# Patient Record
Sex: Female | Born: 1995 | Race: White | Hispanic: Yes | Marital: Single | Smoking: Never smoker
Health system: Southern US, Community
[De-identification: ages and names within clinical notes are randomized; demographics above are authoritative.]

---
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
## 2015-09-02 ENCOUNTER — Encounter: Payer: Self-pay | Admitting: *Deleted

## 2015-09-02 ENCOUNTER — Emergency Department: Payer: Medicaid Other

## 2015-09-02 DIAGNOSIS — Y9289 Other specified places as the place of occurrence of the external cause: Secondary | ICD-10-CM | POA: Insufficient documentation

## 2015-09-02 DIAGNOSIS — S63502A Unspecified sprain of left wrist, initial encounter: Secondary | ICD-10-CM | POA: Diagnosis not present

## 2015-09-02 DIAGNOSIS — Y9389 Activity, other specified: Secondary | ICD-10-CM | POA: Insufficient documentation

## 2015-09-02 DIAGNOSIS — X58XXXA Exposure to other specified factors, initial encounter: Secondary | ICD-10-CM | POA: Diagnosis not present

## 2015-09-02 DIAGNOSIS — Y998 Other external cause status: Secondary | ICD-10-CM | POA: Diagnosis not present

## 2015-09-02 DIAGNOSIS — M25532 Pain in left wrist: Secondary | ICD-10-CM | POA: Diagnosis present

## 2015-09-02 NOTE — ED Notes (Addendum)
Pt has left wrist pain.  Sx for 2 days.  States picked a heavy box at work 2 days ago.  Swelling noted. States WC.

## 2015-09-03 ENCOUNTER — Emergency Department
Admission: EM | Admit: 2015-09-03 | Discharge: 2015-09-03 | Disposition: A | Discharge status: Home / Self Care | Payer: Medicaid Other | Attending: Emergency Medicine | Admitting: Emergency Medicine

## 2015-09-03 DIAGNOSIS — S63502A Unspecified sprain of left wrist, initial encounter: Secondary | ICD-10-CM

## 2015-09-03 MED ORDER — IBUPROFEN 800 MG PO TABS
ORAL_TABLET | ORAL | Status: AC
  Filled 2015-09-03: qty 1

## 2015-09-03 MED ORDER — IBUPROFEN 800 MG PO TABS
800.0000 mg | ORAL_TABLET | Freq: Once | ORAL | Status: AC
  Administered 2015-09-03: 800 mg via ORAL

## 2015-09-03 NOTE — ED Provider Notes (Signed)
[facility] Emergency Department Provider Note  ____________________________________________  Time seen: 3:50 AM  I have reviewed the triage vital signs and the nursing notes.   HISTORY  Chief Complaint Wrist Pain    HPI Helen Bradley is a 19 y.o. female presents with nontraumatic left wrist pain status post lifting boxes at work 2 days ago. Patient states current pain score is 4 out of 10 and nonradiating.     Past medical history None There are no active problems to display for this patient.  Past surgical history None No current outpatient prescriptions on file.  Allergies No known drug allergies No family history on file.  Social History Social History  Substance Use Topics  . Smoking status: Never Smoker   . Smokeless tobacco: Not on file  . Alcohol Use: No    Review of Systems  Constitutional: Negative for fever. Eyes: Negative for visual changes. ENT: Negative for sore throat. Cardiovascular: Negative for chest pain. Respiratory: Negative for shortness of breath. Gastrointestinal: Negative for abdominal pain, vomiting and diarrhea. Genitourinary: Negative for dysuria. Musculoskeletal: Negative for back pain.Positive for left wrist pain Skin: Negative for rash. Neurological: Negative for headaches, focal weakness or numbness.   10-point ROS otherwise negative.  ____________________________________________   PHYSICAL EXAM:  VITAL SIGNS: ED Triage Vitals  Enc Vitals Group     BP 09/02/15 2318 99/65 mmHg     Pulse Rate 09/02/15 2314 78     Resp 09/02/15 2314 20     Temp 09/02/15 2314 98.2 F (36.8 C)     Temp Source 09/02/15 2314 Oral     SpO2 09/02/15 2314 99 %     Weight 09/02/15 2314 200 lb (90.719 kg)     Height 09/02/15 2314 4\' 11"  (1.499 m)     Head Cir --      Peak Flow --      Pain Score 09/02/15 2317 5     Pain Loc --      Pain Edu? --      Excl. in GC? --      Constitutional: Alert and oriented.  Well appearing and in no distress. Eyes: Conjunctivae are normal. PERRL. Normal extraocular movements. ENT   Head: Normocephalic and atraumatic.   Nose: No congestion/rhinnorhea.   Mouth/Throat: Mucous membranes are moist.   Neck: No stridor. Cardiovascular: Normal rate, regular rhythm. Normal and symmetric distal pulses are present in all extremities. No murmurs, rubs, or gallops. Respiratory: Normal respiratory effort without tachypnea nor retractions. Breath sounds are clear and equal bilaterally. No wheezes/rales/rhonchi. Gastrointestinal: Soft and nontender. No distention. There is no CVA tenderness. Genitourinary: deferred Musculoskeletal:  No joint effusions.  Positive left wrist pain pain with range of motion both active and passive Neurologic:  Normal speech and language. No gross focal neurologic deficits are appreciated. Speech is normal.  Skin:  Skin is warm, dry and intact. No rash noted. Psychiatric: Mood and affect are normal. Speech and behavior are normal. Patient exhibits appropriate insight and judgment.      RADIOLOGY  DG Wrist Complete Left (Final result) Result time: 09/03/15 00:00:46   Final result by Rad Results In Interface (09/03/15 00:00:46)   Narrative:   CLINICAL DATA: Lifting boxes at work and started having pain over the radial side of the left wrist.  EXAM: LEFT WRIST - COMPLETE 3+ VIEW  COMPARISON: None.  FINDINGS: There is no evidence of fracture or dislocation. There is no evidence of arthropathy or other focal bone abnormality. Soft  tissues are unremarkable.  IMPRESSION: Negative.   Electronically Signed By: Elberta Fortis M.D. On: 09/03/2015 00:00              INITIAL IMPRESSION / ASSESSMENT AND PLAN / ED COURSE  Pertinent labs & imaging results that were available during my care of the patient were reviewed by me and considered in my medical decision making (see chart for  details).   ____________________________________________   FINAL CLINICAL IMPRESSION(S) / ED DIAGNOSES  Final diagnoses:  Left wrist sprain, initial encounter      Darci Current, MD 09/03/15 [id number]

## 2015-09-03 NOTE — Discharge Instructions (Signed)
Gasper Sells para la Advice worker  (Wrist Splint)  Una frula para la mueca sostiene la [place] en una posicin determinada, de modo que no pueda moverse (posicin fija). Ayuda a que los [employer] rotos y los esguinces se curen ms rpido, con Psychologist, educational. Tambin puede ayudar a Technical sales engineer presin sobre el nervio que baja por la mitad del brazo (nervio mediano) [place] dedos. CUIDADOS EN EL HOGAR   Use la frula segn las indicaciones de su mdico. Puede usarla mientras duerme.  Haga los ejercicios que le indic su mdico. Estos ejercicios ayudan a Pharmacologist la fuerza muscular en la mano y la [place]. Tambin ayudan a [employer] dedos. SOLICITE AYUDA DE INMEDIATO SI:   Pierde la sensibilidad en la mano o en los dedos.  La piel o las yemas de los dedos estn de color azul o gris o los siente fros. ASEGRESE DE QUE:   Comprende estas instrucciones.  Controlar su enfermedad.  Solicitar ayuda de inmediato si no mejora o si empeora. Document Released: 03/05/2009 Document Revised: 02/29/2012 [facility] Patient Information 2015 [place], [place]. This information is not intended to replace advice given to you by your health care provider. Make sure you discuss any questions you have with your health care provider.

## 2016-01-28 ENCOUNTER — Encounter: Payer: Self-pay | Admitting: Emergency Medicine

## 2016-01-28 DIAGNOSIS — G8929 Other chronic pain: Secondary | ICD-10-CM | POA: Insufficient documentation

## 2016-01-28 DIAGNOSIS — M545 Low back pain: Secondary | ICD-10-CM | POA: Insufficient documentation

## 2016-01-28 DIAGNOSIS — Z3202 Encounter for pregnancy test, result negative: Secondary | ICD-10-CM | POA: Insufficient documentation

## 2016-01-28 NOTE — ED Notes (Signed)
Pt arrived to the ED accompanied by her family for complaints of back pain for the last 7 days. Pt states that she has been ignoring the pain but today the pain got worse. Pt denies any injury. Pt is AOx4 in no apparent distress.

## 2016-01-29 ENCOUNTER — Emergency Department
Admission: EM | Admit: 2016-01-29 | Discharge: 2016-01-29 | Disposition: A | Discharge status: Home / Self Care | Payer: Medicaid Other | Attending: Emergency Medicine | Admitting: Emergency Medicine

## 2016-01-29 DIAGNOSIS — M545 Low back pain: Secondary | ICD-10-CM

## 2016-01-29 LAB — URINALYSIS COMPLETE WITH MICROSCOPIC (ARMC ONLY)
BACTERIA UA: NONE SEEN
Bilirubin Urine: NEGATIVE
GLUCOSE, UA: NEGATIVE mg/dL
Ketones, ur: NEGATIVE mg/dL
NITRITE: NEGATIVE
PH: 6 (ref 5.0–8.0)
PROTEIN: 100 mg/dL — AB
Specific Gravity, Urine: 1.028 (ref 1.005–1.030)
Squamous Epithelial / LPF: NONE SEEN

## 2016-01-29 LAB — POCT PREGNANCY, URINE: PREG TEST UR: NEGATIVE

## 2016-01-29 NOTE — Discharge Instructions (Signed)
You have been seen in the Emergency Department (ED)  today for back pain.  Your workup and exam have not shown any acute abnormalities and you are likely suffering from muscle strain or possible problems with your discs, but there is no treatment that will fix your symptoms at this time.  Please take Motrin (ibuprofen) as needed for your pain according to the instructions written on the box.  Alternatively, for the next five days you can take 600mg  three times daily with meals (it may upset your stomach).  Please follow up with your doctor as soon as possible regarding today's ED visit and your back pain.  Return to the ED for worsening back pain, fever, weakness or numbness of either leg, or if you develop either (1) an inability to urinate or have bowel movements, or (2) loss of your ability to control your bathroom functions (if you start having "accidents"), or if you develop other new symptoms that concern you.   Dolor de espalda en adultos (Back Pain, Adult) El dolor de espalda es muy frecuente en los adultos.La causa del dolor de espalda es rara vez peligrosa y Chief Technology Officer a menudo mejora con el [place].Es posible que se desconozca la causa de esta afeccin. Algunas causas comunes son las siguientes:  Distensin de los msculos o ligamentos que sostienen la columna vertebral.  Chiropractor (degeneracin) de los discos vertebrales.  Artritis.  Lesiones directas en la espalda. En [employer], el dolor de espalda es recurrente. Como rara vez es peligroso, las personas pueden aprender a Psychologist, clinical afeccin por s mismas. INSTRUCCIONES PARA EL CUIDADO EN EL HOGAR Controle su dolor de espalda a fin de Public house manager cambio. Las siguientes indicaciones ayudarn a Architectural technologist que pueda sentir:  Medical illustrator. Si permanece sentado o de pie en un mismo lugar durante mucho tiempo, se tensiona la espalda. No se siente, conduzca o permanezca de pie en un mismo lugar durante ms de 30  minutos seguidos. Realice caminatas cortas en superficies planas tan pronto como le sea posible.Trate de caminar un poco ms de Pharmacist, community.  Haga ejercicio regularmente como se lo haya indicado el mdico. El ejercicio ayuda a que su espalda se cure ms rpidamente. Tambin ayuda a prevenir futuras lesiones al [employer] fuertes y flexibles.  No permanezca en la cama.Si hace reposo ms de 1 a 2 das, puede demorar su recuperacin.  Preste atencin a su cuerpo al inclinarse y levantarse. Las posiciones ms cmodas son las que ejercen menos tensin en la espalda en recuperacin. Siempre use tcnicas apropiadas para levantar objetos, como por ejemplo:  Flexionar las rodillas.  Mantener la carga cerca del cuerpo.  No torcerse.  Encuentre una posicin cmoda para dormir. Use un colchn firme y recustese de costado con las rodillas ligeramente flexionadas. Si se recuesta [employer], coloque una almohada debajo de las rodillas.  Evite sentir ansiedad o estrs.El estrs aumenta la tensin muscular y puede empeorar el dolor de espalda.Es importante reconocer si se siente ansioso o estresado y aprender maneras de controlarlo, por ejemplo haciendo ejercicio.  Tome los medicamentos solamente como se lo haya indicado el mdico. Los medicamentos de venta libre para Engineer, materials y la inflamacin a menudo son los ms eficaces.El mdico puede recetarle relajantes musculares.Estos medicamentos ayudan a Primary school teacher de modo que pueda reanudar ms rpidamente sus actividades normales y el ejercicio saludable.  Aplique hielo sobre la zona lesionada.  Ponga el hielo en una bolsa plstica.  Coloque una toalla entre la piel y la bolsa de hielo.  Deje el hielo durante , 2 a 3veces por da, durante los primeros 2 o 3das. Despus de eso, puede alternar el hielo y el calor para reducir Chief Technology Officer y los espasmos.  Mantenga un peso saludable. El exceso de peso ejerce  presin adicional sobre la espalda y hace que resulte difcil mantener una buena [place]. SOLICITE ATENCIN MDICA SI:  Siente un dolor que no se alivia con reposo o medicamentos.  Siente mucho dolor que se extiende a las piernas o los glteos.  El dolor no mejora en una semana.  Siente dolor por la noche.  Pierde peso.  Siente escalofros o fiebre. SOLICITE ATENCIN MDICA DE INMEDIATO SI:   Tiene nuevos problemas para controlar la vejiga o los intestinos.  Siente debilidad o adormecimiento inusuales en los brazos o en las piernas.  Siente nuseas o vmitos.  Siente dolor abdominal.  Siente que va a desmayarse.   Esta informacin no tiene Theme park manager el consejo del mdico. Asegrese de hacerle al mdico cualquier pregunta que tenga.   Document Released: 12/07/2005 Document Revised: 12/28/2014 Elsevier Interactive Patient Education 2016 Elsevier Inc.  Terapia con calor (Heat Therapy) La terapia con calor puede ayudar a aliviar articulaciones y msculos doloridos, lesionados, tensos y rgidos. El calor [employer], lo cual puede ayudar a Engineer, materials.  RIESGOS Y COMPLICACIONES Si tiene cualquiera de los [address], no utilice la terapia con calor a menos que su mdico lo haya autorizado:  Mala circulacin.  Heridas que se estn curando o piel con cicatrices en la zona a tratar.  Diabetes, enfermedades cardacas o hipertensin arterial.  Incapacidad de sentir (entumecimiento) la zona tratada.  Hinchazn inusual de la zona a tratar.  Infecciones activas.  Cogulos sanguneos.  Cncer.  Incapacidad de Marketing executive. Esto puede incluir nios pequeos y personas que tienen problemas con la funcin cerebral (demencia).  Embarazo. La terapia con calor solo se debe usar en lesiones viejas, preexistentes o de larga duracin (crnicas). No utilice la terapia con calor en lesiones nuevas a menos que el mdico se lo indique. CMO USAR LA  TERAPIA CON CALOR Existen varios tipos distintos de terapia con calor, como:  Compresas hmedas calientes.  Bao de agua caliente.  Bolsa de agua caliente.  Almohadilla trmica.  Bolsa de gel caliente.  Vendaje caliente.  Almohadilla trmica. Utilice el mtodo de terapia con calor que le sugiera su mdico. Siga las indicaciones del mdico sobre cmo y cundo usar la terapia con Airline pilot. RECOMENDACIONES GENERALES PARA LA TERAPIA CON CALOR  No duerma mientras [place] la terapia con calor. Utilice la terapia con calor solo mientras est despierto.  La piel puede volverse rosada mientras [place] la terapia con calor. No use la terapia con calor si la piel se pone roja.  No use la terapia con calor si siente un dolor nuevo.  Una temperatura muy alta o una exposicin prolongada al calor puede causar quemaduras. Sea cauto con la terapia de calor para evitar quemar la piel.  No use la terapia con calor en zonas de la piel que ya estn irritadas, como con una erupcin o una quemadura de sol. SOLICITE ATENCIN MDICA SI:  Observa ampollas, enrojecimiento, hinchazn o adormecimiento.  Siente un dolor nuevo.  El dolor [place]. ASEGRESE DE QUE:  Comprende estas instrucciones.  Controlar su afeccin.  Recibir ayuda de inmediato si no mejora o si empeora.   Esta informacin no tiene [employer]  fin reemplazar el consejo del mdico. Asegrese de hacerle al mdico cualquier pregunta que tenga.   Document Released: 02/29/2012 Document Revised: 12/28/2014 Elsevier Interactive Patient Education 2016 [employer].  Google msculoesqueltico (Musculoskeletal Pain) El dolor musculoesqueltico se siente en huesos y msculos. El dolor puede ocurrir en cualquier parte del cuerpo. El profesional que lo asiste podr tratarlo sin Geologist, engineering causa del dolor. Lo tratar Time [employer] de laboratorio (sangre y [place]), las radiografas y otros estudios sean normales. La causa de estos dolores puede ser un  virus.  CAUSAS Generalmente no existe una causa definida para este trastorno. Tambin el Citigroup puede deberse a la [place]. En la actividad excesiva se incluye el hacer ejercicios fsicos muy intensos cuando no se est en buena forma. El dolor de huesos tambin puede deberse a cambios climticos. Los huesos son sensibles a los cambios en la presin atmosfrica. INSTRUCCIONES PARA EL CUIDADO DOMICILIARIO  Para proteger su privacidad, no se entregarn los [employer] pruebas por telfono. Asegrese de conseguirlos. Consulte el modo en que podr obtenerlos si no se lo han informado. Es su responsabilidad contar con los [employer].  Utilice los medicamentos de venta libre o de prescripcin para Chief Technology Officer, Environmental health practitioner o la [place], segn se lo indique el profesional que lo asiste. Si le han administrado medicamentos, no conduzca, no opere maquinarias ni Diplomatic Services operational officer, y tampoco firme documentos legales durante 24 horas. No beba alcohol. No tome pldoras para dormir ni otros medicamentos que Museum/gallery curator.  Podr seguir con todas las actividades a menos que stas le ocasionen ms [employer]ck & [employer]. Cuando el dolor disminuya, es importante que gradualmente reanude toda la rutina habitual. Retome las actividades comenzando lentamente. Aumente gradualmente la intensidad y la duracin de sus actividades o del ejercicio.  Durante los perodos de dolor intenso, el reposo en cama puede ser beneficioso. Recustese o sintese en la posicin que le sea ms cmoda.  Coloque hielo sobre la zona afectada.  Ponga hielo en [place] Shut[place].  Colquese una toalla entre la piel y la bolsa de hielo.  Aplique el hielo durante 10 a 20 minutos 3  4 veces por da.  Si el dolor empeora, o no desaparece puede ser [employer] repetir las pruebas o Education officer, environmental nuevos exmenes. El profesional que lo asiste podr requerir investigar ms profundamente para Veterinary surgeon causa  posible. SOLICITE ATENCIN MDICA DE INMEDIATO SI:  Siente que el dolor empeora y no se alivia con los medicamentos.  Siente dolor en el pecho asociado a falta de aire, sudoracin, nuseas o vmitos.  El dolor se localiza en el abdomen.  Comienza a sentir nuevos sntomas que parecen ser diferentes o que lo preocupan. ASEGRESE DE QUE:   Comprende las instrucciones para el alta mdica.  Controlar su enfermedad.  Solicitar atencin mdica de inmediato segn las indicaciones.   Esta informacin no tiene Theme park manager el consejo del mdico. Asegrese de hacerle al mdico cualquier pregunta que tenga.   Document Released: 09/16/2005 Document Revised: 02/29/2012 Elsevier Interactive Patient Education [employer].
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
## 2016-01-29 NOTE — ED Notes (Signed)
Negative urine pregnancy

## 2016-01-30 LAB — URINE CULTURE: SPECIAL REQUESTS: NORMAL

## 2016-08-11 IMAGING — CR DG WRIST COMPLETE 3+V*L*
1 series · 4 of 4 positions shown · non-contrast
Comparison: None.

CLINICAL DATA: Lifting boxes at work and started having pain over
the radial side of the left wrist.

EXAM:
LEFT WRIST - COMPLETE 3+ VIEW

[Series 1: dg wrist complete left · 0.14mm/px · 4 of 4 slices shown]
[im 1/4]
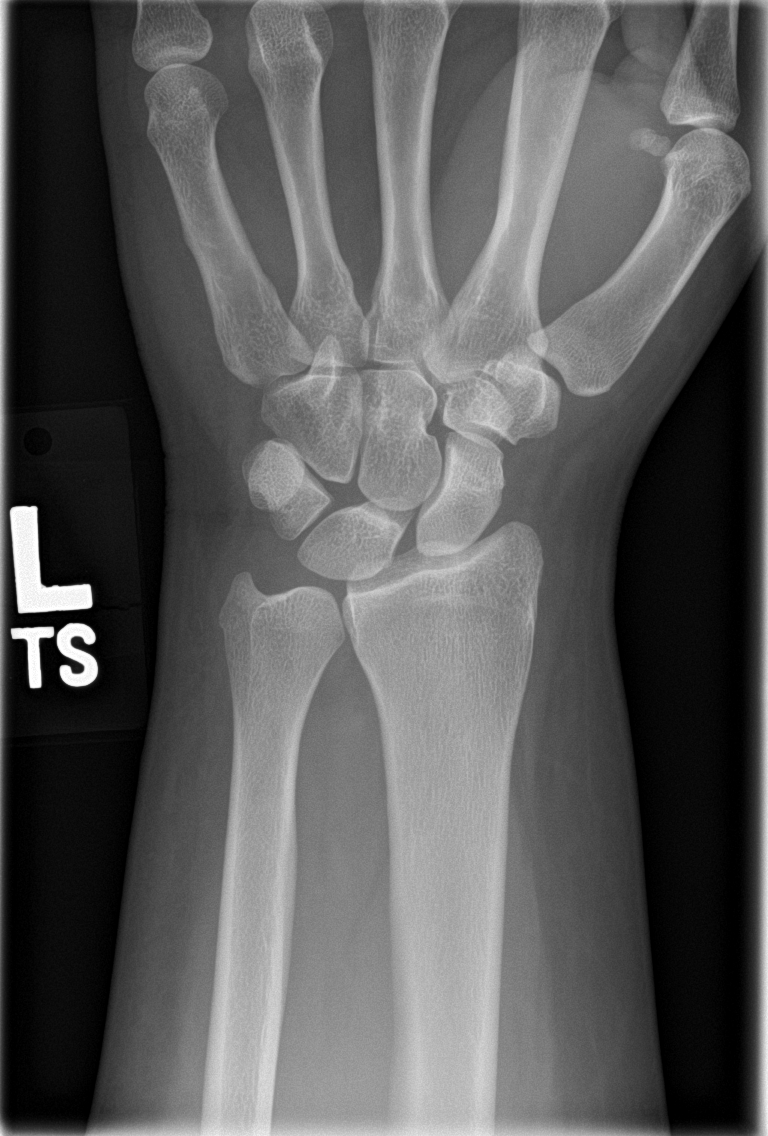
[im 2/4]
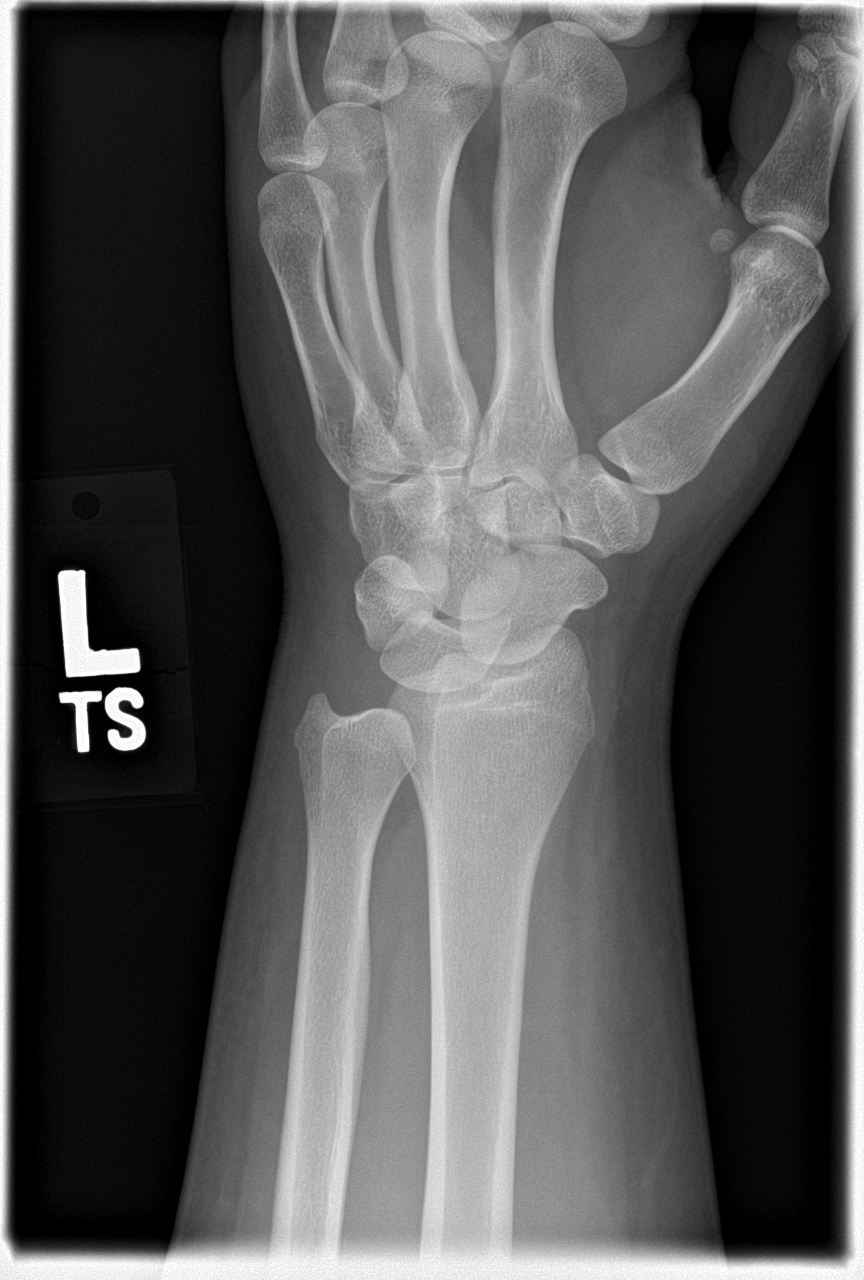
[im 3/4]
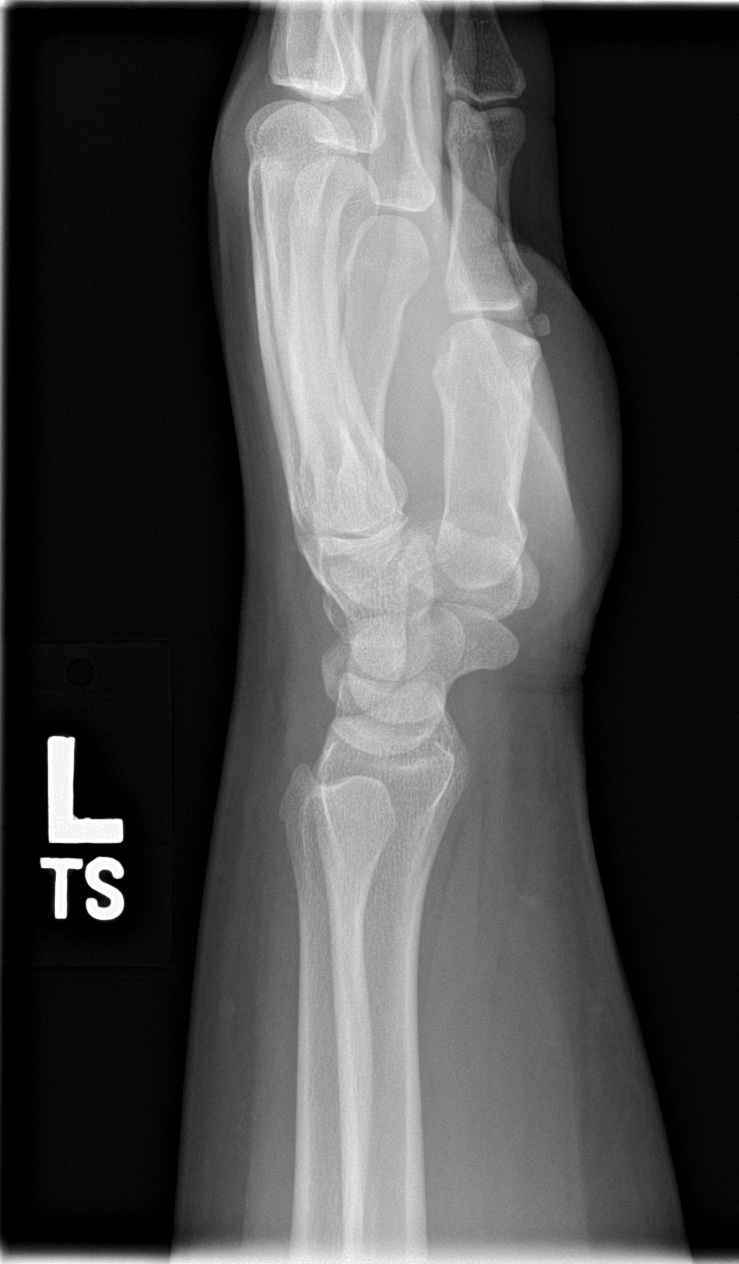
[im 4/4]
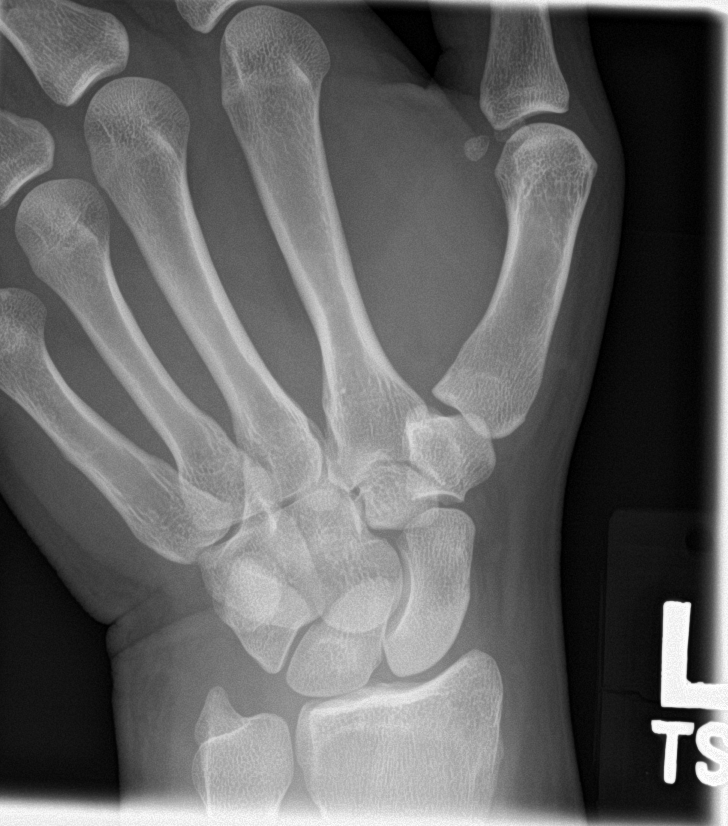

[4 of 4 positions shown; findings below may reference images not displayed]

FINDINGS: There is no evidence of fracture or dislocation. There is no
evidence of arthropathy or other focal bone abnormality. Soft
tissues are unremarkable.
IMPRESSION: Negative.

## 2018-06-21 ENCOUNTER — Encounter: Payer: Self-pay | Admitting: Emergency Medicine

## 2018-06-21 ENCOUNTER — Emergency Department: Payer: No Typology Code available for payment source

## 2018-06-21 ENCOUNTER — Emergency Department
Admission: EM | Admit: 2018-06-21 | Discharge: 2018-06-21 | Disposition: A | Discharge status: Home / Self Care | Payer: No Typology Code available for payment source | Attending: Emergency Medicine | Admitting: Emergency Medicine

## 2018-06-21 DIAGNOSIS — S161XXA Strain of muscle, fascia and tendon at neck level, initial encounter: Secondary | ICD-10-CM | POA: Diagnosis not present

## 2018-06-21 DIAGNOSIS — Y939 Activity, unspecified: Secondary | ICD-10-CM | POA: Diagnosis not present

## 2018-06-21 DIAGNOSIS — S199XXA Unspecified injury of neck, initial encounter: Secondary | ICD-10-CM | POA: Diagnosis present

## 2018-06-21 DIAGNOSIS — Y92410 Unspecified street and highway as the place of occurrence of the external cause: Secondary | ICD-10-CM | POA: Diagnosis not present

## 2018-06-21 DIAGNOSIS — Y999 Unspecified external cause status: Secondary | ICD-10-CM | POA: Diagnosis not present

## 2018-06-21 MED ORDER — CYCLOBENZAPRINE HCL 5 MG PO TABS: ORAL_TABLET | ORAL | 0 refills | Status: AC

## 2018-06-21 MED ORDER — IBUPROFEN 600 MG PO TABS: 600.0000 mg | ORAL_TABLET | Freq: Four times a day (QID) | ORAL | 0 refills | Status: AC | PRN
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
## 2019-07-06 ENCOUNTER — Other Ambulatory Visit: Payer: Self-pay

## 2019-07-06 DIAGNOSIS — Z20822 Contact with and (suspected) exposure to covid-19: Secondary | ICD-10-CM

## 2019-07-10 LAB — NOVEL CORONAVIRUS, NAA: SARS-CoV-2, NAA: NOT DETECTED

## 2019-08-03 ENCOUNTER — Other Ambulatory Visit: Payer: Self-pay

## 2019-08-03 DIAGNOSIS — Z20822 Contact with and (suspected) exposure to covid-19: Secondary | ICD-10-CM

## 2019-08-04 LAB — NOVEL CORONAVIRUS, NAA: SARS-CoV-2, NAA: DETECTED — AB

## 2019-10-26 DIAGNOSIS — R102 Pelvic and perineal pain: Secondary | ICD-10-CM | POA: Insufficient documentation

## 2019-10-26 DIAGNOSIS — D649 Anemia, unspecified: Secondary | ICD-10-CM | POA: Insufficient documentation

## 2019-10-26 DIAGNOSIS — N939 Abnormal uterine and vaginal bleeding, unspecified: Secondary | ICD-10-CM | POA: Insufficient documentation

## 2019-10-27 ENCOUNTER — Other Ambulatory Visit: Payer: Self-pay

## 2019-10-27 ENCOUNTER — Encounter: Payer: Self-pay | Admitting: Emergency Medicine

## 2019-10-27 ENCOUNTER — Emergency Department
Admission: EM | Admit: 2019-10-27 | Discharge: 2019-10-27 | Disposition: A | Discharge status: Home / Self Care | Payer: Self-pay | Attending: Emergency Medicine | Admitting: Emergency Medicine

## 2019-10-27 ENCOUNTER — Emergency Department: Payer: Self-pay

## 2019-10-27 DIAGNOSIS — N939 Abnormal uterine and vaginal bleeding, unspecified: Secondary | ICD-10-CM

## 2019-10-27 DIAGNOSIS — D649 Anemia, unspecified: Secondary | ICD-10-CM

## 2019-10-27 DIAGNOSIS — R102 Pelvic and perineal pain: Secondary | ICD-10-CM

## 2019-10-27 LAB — COMPREHENSIVE METABOLIC PANEL
ALT: 19 U/L (ref 0–44)
AST: 20 U/L (ref 15–41)
Albumin: 3.9 g/dL (ref 3.5–5.0)
Alkaline Phosphatase: 49 U/L (ref 38–126)
Anion gap: 9 (ref 5–15)
BUN: 12 mg/dL (ref 6–20)
CO2: 23 mmol/L (ref 22–32)
Calcium: 8.8 mg/dL — ABNORMAL LOW (ref 8.9–10.3)
Chloride: 110 mmol/L (ref 98–111)
Creatinine, Ser: 0.58 mg/dL (ref 0.44–1.00)
GFR calc Af Amer: >60 mL/min (ref >60)
GFR calc non Af Amer: >60 mL/min (ref >60)
Glucose, Bld: 100 mg/dL — ABNORMAL HIGH (ref 70–99)
Potassium: 3.5 mmol/L (ref 3.5–5.1)
Sodium: 142 mmol/L (ref 135–145)
Total Bilirubin: 0.4 mg/dL (ref 0.3–1.2)
Total Protein: 7.2 g/dL (ref 6.5–8.1)

## 2019-10-27 LAB — CBC WITH DIFFERENTIAL/PLATELET
Abs Immature Granulocytes: 0.01 10*3/uL (ref 0.00–0.07)
Basophils Absolute: 0 10*3/uL (ref 0.0–0.1)
Basophils Relative: 0 %
Eosinophils Absolute: 0.1 10*3/uL (ref 0.0–0.5)
Eosinophils Relative: 2 %
HCT: 30.1 % — ABNORMAL LOW (ref 36.0–46.0)
Hemoglobin: 8.8 g/dL — ABNORMAL LOW (ref 12.0–15.0)
Immature Granulocytes: 0 %
Lymphocytes Relative: 34 %
Lymphs Abs: 2.5 10*3/uL (ref 0.7–4.0)
MCH: 20.2 pg — ABNORMAL LOW (ref 26.0–34.0)
MCHC: 29.2 g/dL — ABNORMAL LOW (ref 30.0–36.0)
MCV: 69 fL — ABNORMAL LOW (ref 80.0–100.0)
Monocytes Absolute: 0.6 10*3/uL (ref 0.1–1.0)
Monocytes Relative: 8 %
Neutro Abs: 4.1 10*3/uL (ref 1.7–7.7)
Neutrophils Relative %: 56 %
Platelets: 483 10*3/uL — ABNORMAL HIGH (ref 150–400)
RBC: 4.36 MIL/uL (ref 3.87–5.11)
RDW: 17 % — ABNORMAL HIGH (ref 11.5–15.5)
WBC: 7.3 10*3/uL (ref 4.0–10.5)
nRBC: 0 % (ref 0.0–0.2)

## 2019-10-27 LAB — URINALYSIS, COMPLETE (UACMP) WITH MICROSCOPIC
Bacteria, UA: NONE SEEN
Bilirubin Urine: NEGATIVE
Glucose, UA: NEGATIVE mg/dL
Ketones, ur: NEGATIVE mg/dL
Nitrite: NEGATIVE
Protein, ur: 100 mg/dL — AB
RBC / HPF: >50 RBC/hpf — ABNORMAL HIGH (ref 0–5)
Specific Gravity, Urine: 1.024 (ref 1.005–1.030)
pH: 6 (ref 5.0–8.0)

## 2019-10-27 LAB — IRON AND TIBC
Iron: 13 ug/dL — ABNORMAL LOW (ref 28–170)
Saturation Ratios: 3 % — ABNORMAL LOW (ref 10.4–31.8)
TIBC: 496 ug/dL — ABNORMAL HIGH (ref 250–450)
UIBC: 483 ug/dL

## 2019-10-27 LAB — POCT PREGNANCY, URINE: Preg Test, Ur: NEGATIVE

## 2019-10-27 LAB — FERRITIN: Ferritin: 2 ng/mL — ABNORMAL LOW (ref 11–307)

## 2019-10-27 MED ORDER — KETOROLAC TROMETHAMINE 30 MG/ML IJ SOLN
30.0000 mg | Freq: Once | INTRAMUSCULAR | Status: AC
  Administered 2019-10-27: 30 mg via INTRAMUSCULAR
  Filled 2019-10-27: qty 1

## 2019-10-27 MED ORDER — NORGESTIM-ETH ESTRAD TRIPHASIC 0.18/0.215/0.25 MG-25 MCG PO TABS: 1.0000 | ORAL_TABLET | Freq: Every day | ORAL | 5 refills | Status: AC

## 2019-10-27 MED ORDER — ACETAMINOPHEN 325 MG PO TABS
650.0000 mg | ORAL_TABLET | Freq: Once | ORAL | Status: AC
  Administered 2019-10-27: 05:00:00 650 mg via ORAL
  Filled 2019-10-27: qty 2

## 2019-10-27 MED ORDER — FERROUS SULFATE 325 (65 FE) MG PO TBEC: 325.0000 mg | DELAYED_RELEASE_TABLET | Freq: Two times a day (BID) | ORAL | 1 refills | Status: AC

## 2019-10-27 NOTE — ED Triage Notes (Signed)
Patient has been having menstrual cycles lasting up to 25 days the last few cycles wity heavy bleeding. Today she stated having very bad pain over right ovary and is on day 20. Patient has not seen an GYN for this issue. Patient has taken ibuprofen and it has not helped.

## 2019-10-27 NOTE — ED Provider Notes (Signed)
[facility] Emergency Department Provider Note  ____________________________________________   First MD Initiated Contact with Patient 10/27/19 [phone]     (approximate)  I have reviewed the triage vital signs and the nursing notes.   HISTORY  Chief Complaint Vaginal Bleeding    HPI Helen Bradley is a 23 y.o. female who presents with vaginal bleeding.  Patient had menstrual cycles lasting up to 25 days with very few days of no bleeding.  This is been going on since July.  She is not had her blood levels checked previously.  She denies any shortness of breath or chest pain.  Occasionally some mild fatigue but none currently.  She presented today due to severe pain in her right pelvic region.  This been going on for the past 2 days, constant, nothing makes it better, nothing makes it worse.  She denies any fevers or vomiting.          History reviewed. No pertinent past medical history.  There are no active problems to display for this patient.   History reviewed. No pertinent surgical history.  Prior to Admission medications   Medication Sig Start Date End Date Taking? Authorizing Provider  cyclobenzaprine (FLEXERIL) 5 MG tablet Take 1-2 tablets 3 times daily as needed 06/21/18   Enid Derry, PA-C  ibuprofen (ADVIL,MOTRIN) 600 MG tablet Take 1 tablet (600 mg total) by mouth every 6 (six) hours as needed. 06/21/18   Enid Derry, PA-C    Allergies Patient has no known allergies.  No family history on file.  Social History Social History   Tobacco Use  . Smoking status: Never Smoker  Substance Use Topics  . Alcohol use: Yes  . Drug use: No      Review of Systems Constitutional: No fever/chills Eyes: No visual changes. ENT: No sore throat. Cardiovascular: Denies chest pain. Respiratory: Denies shortness of breath. Gastrointestinal: Right lower quadrant pain.  No nausea, no vomiting.  No diarrhea.  No constipation. Genitourinary:  Positive vaginal bleeding Musculoskeletal: Negative for back pain. Skin: Negative for rash. Neurological: Negative for headaches, focal weakness or numbness. All other ROS negative ____________________________________________   PHYSICAL EXAM:  VITAL SIGNS: ED Triage Vitals  Enc Vitals Group     BP 10/26/19 2346 139/63     Pulse Rate 10/26/19 2346 97     Resp 10/26/19 2346 16     Temp 10/26/19 2346 98.4 F (36.9 C)     Temp Source 10/26/19 2346 Oral     SpO2 10/26/19 2346 100 %     Weight 10/26/19 2346 205 lb (93 kg)     Height 10/26/19 2346 5' (1.524 m)     Head Circumference --      Peak Flow --      Pain Score 10/27/19 0004 8     Pain Loc --      Pain Edu? --      Excl. in GC? --     Constitutional: Alert and oriented. Well appearing and in no acute distress. Eyes: Conjunctivae are normal. EOMI. Head: Atraumatic. Nose: No congestion/rhinnorhea. Mouth/Throat: Mucous membranes are moist.   Neck: No stridor. Trachea Midline. FROM Cardiovascular: Normal rate, regular rhythm. Grossly normal heart sounds.  Good peripheral circulation. Respiratory: Normal respiratory effort.  No retractions. Lungs CTAB. Gastrointestinal: Soft and nontender. No distention. No abdominal bruits.  Musculoskeletal: No lower extremity tenderness nor edema.  No joint effusions. Neurologic:  Normal speech and language. No gross focal neurologic deficits are appreciated.  Skin:  Skin is warm, dry and intact. No rash noted. Psychiatric: Mood and affect are normal. Speech and behavior are normal. GU: Very low right pelvic pain externally.  Declined vaginal exam  ____________________________________________   LABS (all labs ordered are listed, but only abnormal results are displayed)  Labs Reviewed  CBC WITH DIFFERENTIAL/PLATELET - Abnormal; Notable for the following components:      Result Value   Hemoglobin 8.8 (*)    HCT 30.1 (*)    MCV 69.0 (*)    MCH 20.2 (*)    MCHC 29.2 (*)    RDW 17.0  (*)    Platelets 483 (*)    All other components within normal limits  COMPREHENSIVE METABOLIC PANEL - Abnormal; Notable for the following components:   Glucose, Bld 100 (*)    Calcium 8.8 (*)    All other components within normal limits  URINALYSIS, COMPLETE (UACMP) WITH MICROSCOPIC - Abnormal; Notable for the following components:   Color, Urine ORANGE (*)    APPearance CLOUDY (*)    Hgb urine dipstick LARGE (*)    Protein, ur 100 (*)    Leukocytes,Ua SMALL (*)    RBC / HPF >50 (*)    All other components within normal limits  IRON AND TIBC - Abnormal; Notable for the following components:   Iron 13 (*)    TIBC 496 (*)    Saturation Ratios 3 (*)    All other components within normal limits  FERRITIN - Abnormal; Notable for the following components:   Ferritin 2 (*)    All other components within normal limits  POC URINE PREG, ED  POCT PREGNANCY, URINE   ____________________________________________  RADIOLOGY   Official radiology report(s): US Pelvis (transabdominal Only)  Result Date: 10/27/2019 CLINICAL DATA:  Pelvic pain with vaginal bleeding EXAM: TRANSABDOMINAL ULTRASOUND OF PELVIS TECHNIQUE: Transabdominal ultrasound examination of the pelvis was performed including evaluation of the uterus, ovaries, adnexal regions, and pelvic cul-de-sac. COMPARISON:  None. FINDINGS: Uterus Measurements: 6.4 x 3.9 x 4.3 cm = volume: Is 57 mL. No fibroids or other mass visualized. Endometrium Thickness: 6 mm.  No focal abnormality visualized. Right ovary Measurements: 3.6 x 2.4 x 2.5 cm = volume: There is 11 mL. There is a 1.8 cm dominant follicle. Left ovary Measurements: 3 x 2 x 1.8 cm = volume: 5.8 mL. Normal appearance/no adnexal mass. Other findings:  No abnormal free fluid. IMPRESSION: Normal study. Electronically Signed   By: Constance Holster M.D.   On: 10/27/2019 03:18    ____________________________________________   PROCEDURES  Procedure(s) performed (including Critical  Care):  Procedures   ____________________________________________   INITIAL IMPRESSION / ASSESSMENT AND PLAN / ED COURSE  Helen Bradley was evaluated in Emergency Department on 10/27/2019 for the symptoms described in the history of present illness. She was evaluated in the context of the global COVID-19 pandemic, which necessitated consideration that the patient might be at risk for infection with the SARS-CoV-2 virus that causes COVID-19. Institutional protocols and algorithms that pertain to the evaluation of patients at risk for COVID-19 are in a state of rapid change based on information released by regulatory bodies including the CDC and federal and state organizations. These policies and algorithms were followed during the patient's care in the ED.     Patient is a 23 year old who presents with irregular bleeding for the past 5 months.  Will check labs to evaluate for anemia.  Will get pregnancy test to evaluate for pregnancy.  Transabdominal and transvaginal ultrasound  was ordered in triage to help assess for fibroids, cyst, rule out torsion.  Patient declined the transvaginal portion given she is not been sexually active.  I explained to patient that we cannot effectively rule out torsion but my suspicion was very low given her ovaries on the transabdominal were normal size without evidence of large cyst.  Patient's pain was very right lower pelvic.  Almost in the crease of her thigh.  She declined pelvic exam her due to her not being sexually active therefore low suspicion for STD.  No hernia appreciated on exam.  We discussed CT imaging to evaluate for appendicitis which seems very unlikely given the location of the pain and her normal white count and no fever.  I offered CT imaging but patient declined at this time.  She understands that if she develops fevers, continued pain, vomiting or any other concerns she should return to the ER.  Pregnancy test is negative.  Hemoglobin is 8.8.   Patient does not meet transfusion threshold and she is not symptomatic from this hemoglobin.  MCV is low suspect is secondary to low iron.  And with stopping the bleeding and correcting the iron levels I suspect that her levels will get better.  Patient was agreeable with holding off on transfusion at this time.  We will send patient home with Ortho Tri-Cyclen and iron.  She understands that she should follow-up with her primary doctor or the health department in 1 week for blood retest.   ____________________________________________   FINAL CLINICAL IMPRESSION(S) / ED DIAGNOSES   Final diagnoses:  Vaginal bleeding  Pelvic pain  Anemia, unspecified type      MEDICATIONS GIVEN DURING THIS VISIT:  Medications  ketorolac (TORADOL) 30 MG/ML injection 30 mg (30 mg Intramuscular Given 10/27/19 0431)  acetaminophen (TYLENOL) tablet 650 mg (650 mg Oral Given 10/27/19 0430)     ED Discharge Orders         Ordered    Norgestimate-Ethinyl Estradiol Triphasic (ORTHO TRI-CYCLEN LO) 0.18/0.215/0.25 MG-25 MCG tab  Daily     10/27/19 0420    ferrous sulfate 325 (65 FE) MG EC tablet  2 times daily     10/27/19 0502           Note:  This document was prepared using Dragon voice recognition software and may include unintentional dictation errors.   Concha SeFunke, Sidda Humm E, MD 10/27/19 [phone]

## 2019-10-27 NOTE — Discharge Instructions (Addendum)
Your blood levels were low at 8.8.  We are starting you on iron pills due to your very low iron levels.  We also start you on birth control to help stop your vaginal bleeding.  You should have a recheck hemoglobin in 1 week.  If it is less than 7 you will need a blood transfusion or if you become increasingly symptomatic.  You should return to the ER for any other concerns.  You should follow-up with an OB doctor

## 2020-10-05 IMAGING — US US PELVIS COMPLETE
1 series · 14 of 25 positions shown · non-contrast
Comparison: None.

CLINICAL DATA: Pelvic pain with vaginal bleeding

EXAM:
TRANSABDOMINAL ULTRASOUND OF PELVIS
TECHNIQUE: Transabdominal ultrasound examination of the pelvis was performed
including evaluation of the uterus, ovaries, adnexal regions, and
pelvic cul-de-sac.

[Series 1: us pelvis complete · 14 of 36 slices shown]
[im 1/36]
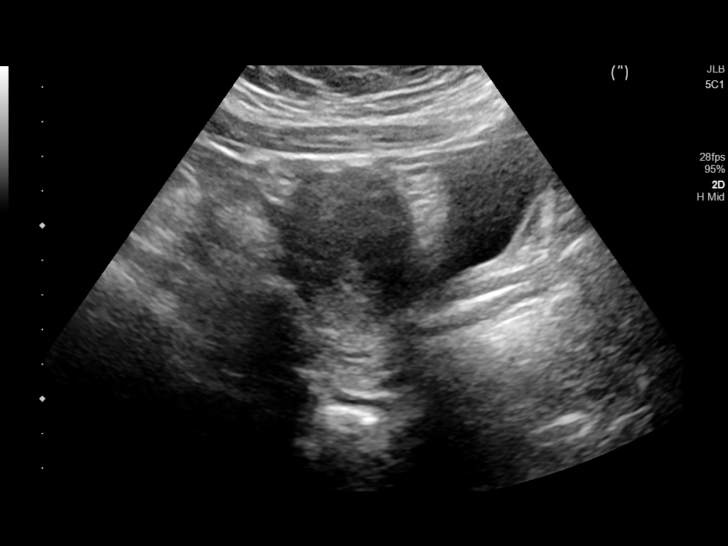
[im 3/36]
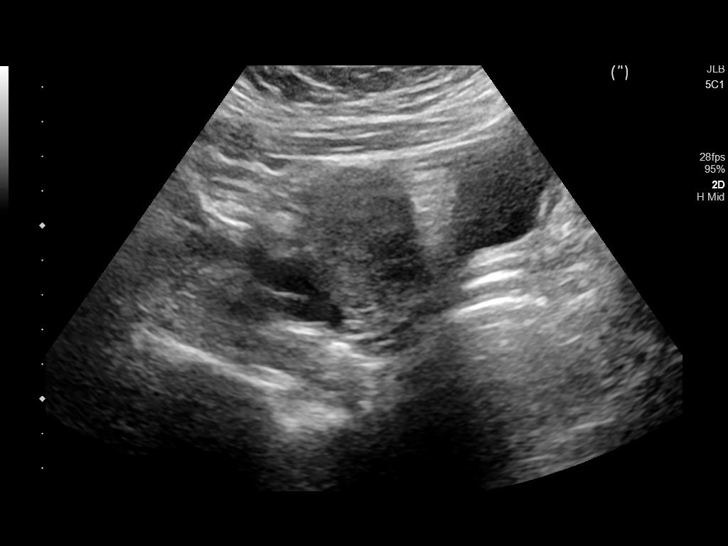
[im 6/36]
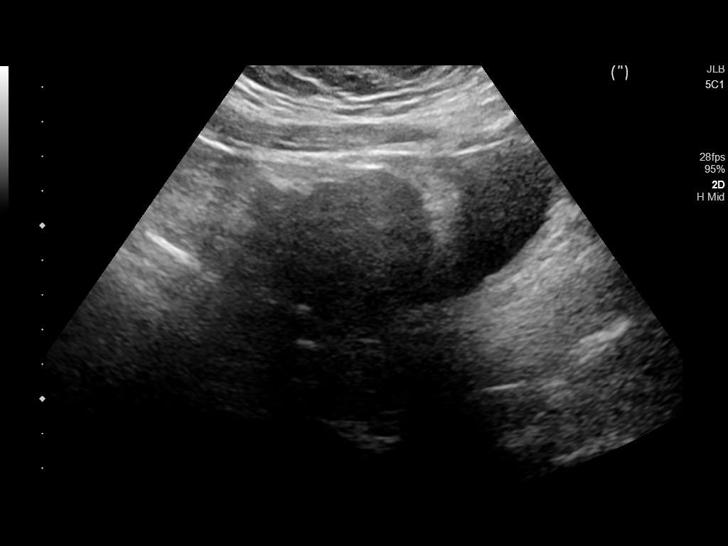
[im 9/36]
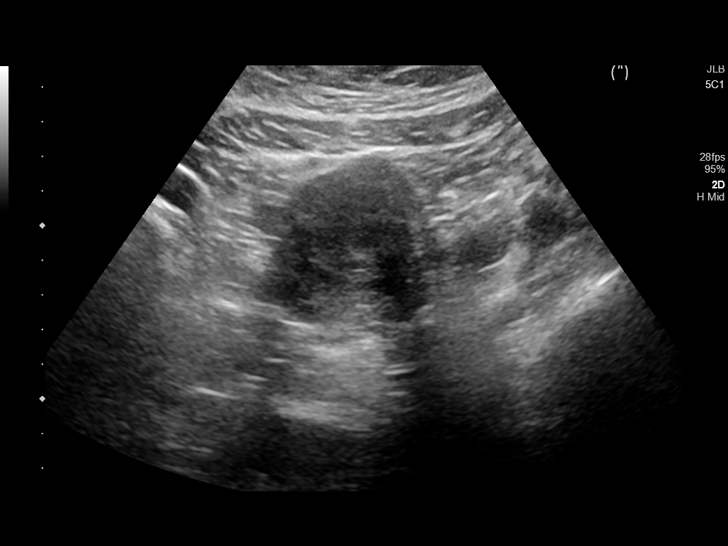
[im 12/36]
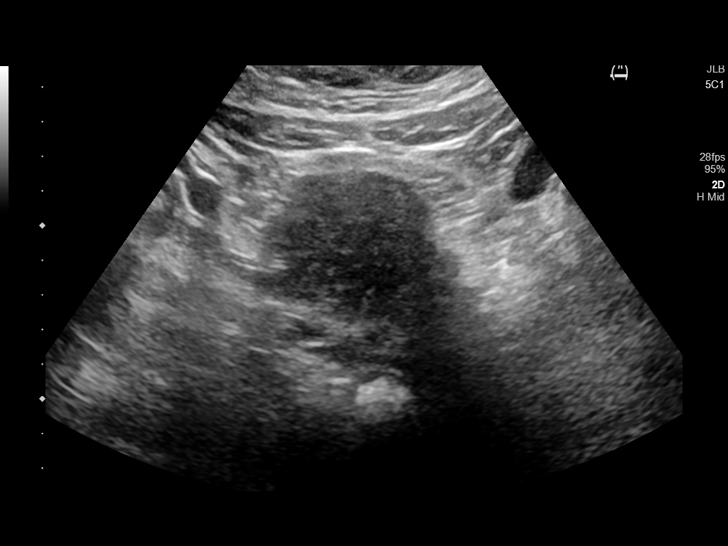
[im 14/36]
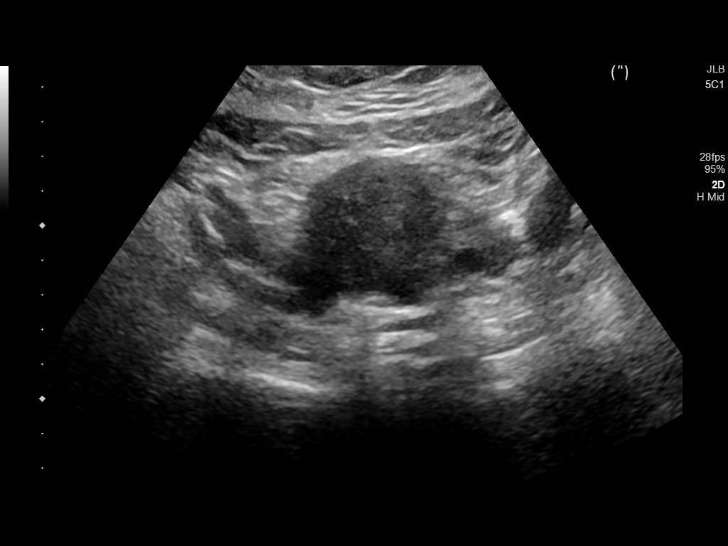
[im 17/36]
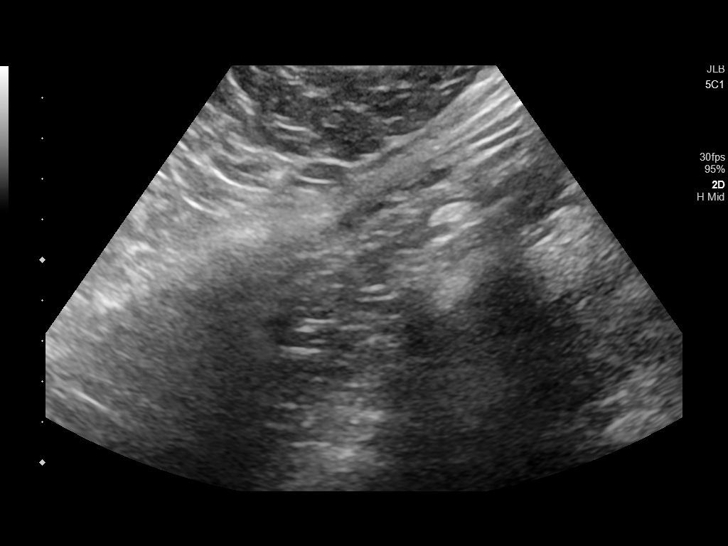
[im 19/36]
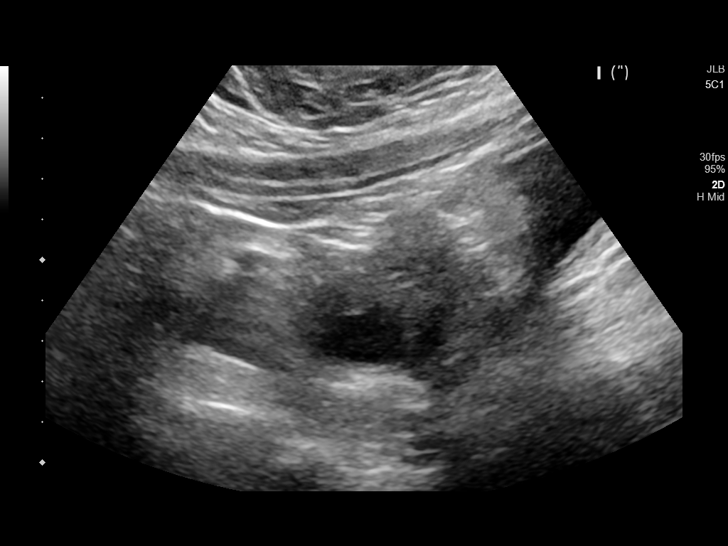
[im 22/36]
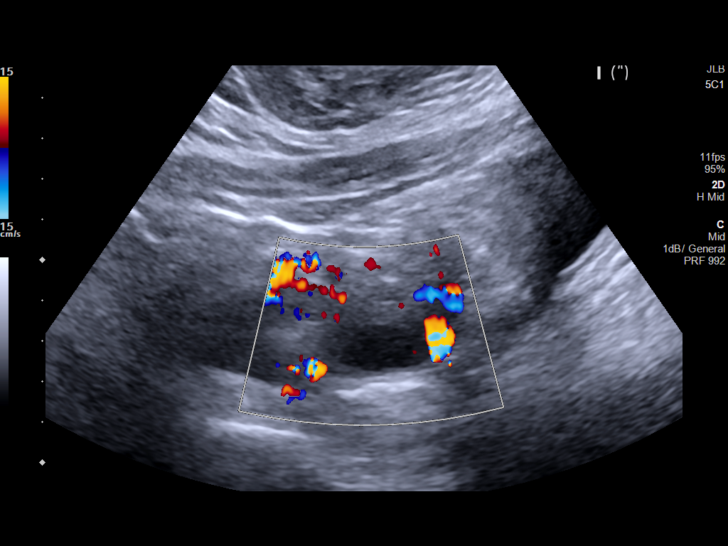
[im 24/36]
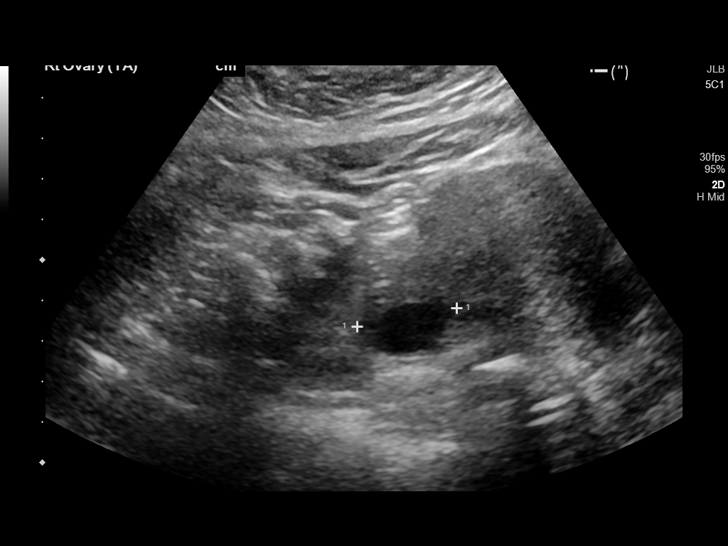
[im 27/36]
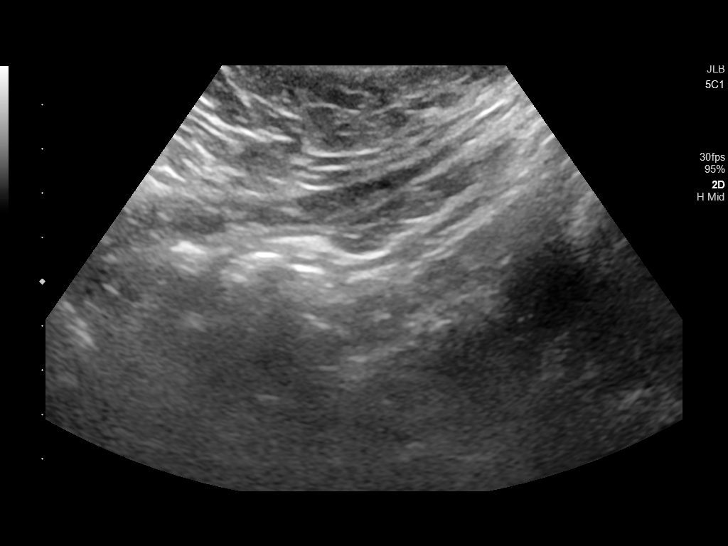
[im 30/36]
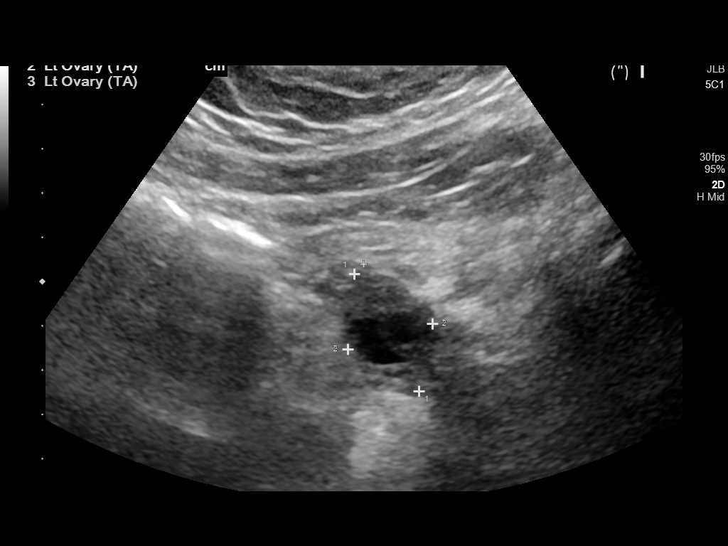
[im 33/36]
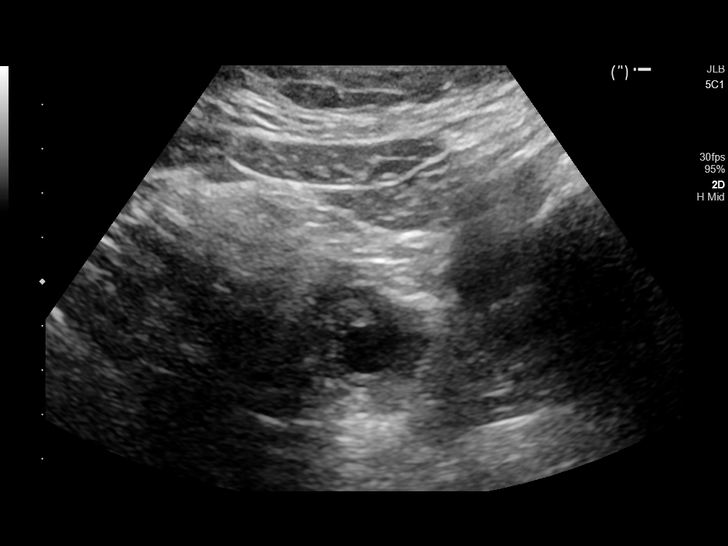
[im 36/36]
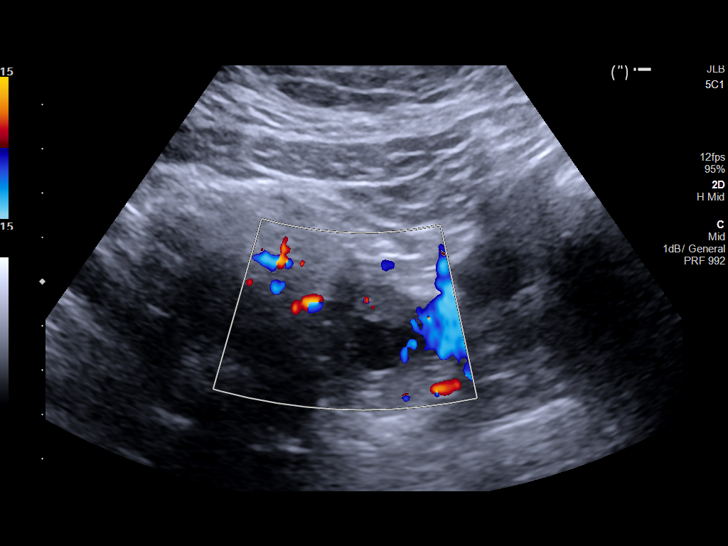

[14 of 25 positions shown; findings below may reference images not displayed]

FINDINGS: Uterus

Measurements: 6.4 x 3.9 x 4.3 cm = volume: Is 57 mL. No fibroids or
other mass visualized.

Endometrium

Thickness: 6 mm.  No focal abnormality visualized.

Right ovary

Measurements: 3.6 x 2.4 x 2.5 cm = volume: There is 11 mL. There is
a 1.8 cm dominant follicle.

Left ovary

Measurements: 3 x 2 x 1.8 cm = volume: 5.8 mL. Normal appearance/no
adnexal mass.

Other findings:  No abnormal free fluid.
IMPRESSION: Normal study.

## 2021-02-28 ENCOUNTER — Telehealth: Payer: Self-pay | Admitting: *Deleted

## 2021-02-28 NOTE — Telephone Encounter (Signed)
Incoming referral from [facility] for IDA. Patient's hgb is 4.5 today. Office is requesting patient to evaluated in cancer center today vs ER. Returned [employer] phone call. Given the critical hemoglobin, patient should go to the ER today to be transfused asap. After hgb is stabilized, then pt could be evaluated in cancer center for apt. KC will let the patient know to go to the ER    (ABNORMAL) CBC w/auto Differential (3 Part) (02/28/2021 10:37 AM EST) (ABNORMAL) CBC w/auto Differential (3 Part) (02/28/2021 10:37 AM EST)  Component Value Ref Range Performed At Pathologist Signature  WBC [facility] Blood Cell Count) 3.6 (L) 4.1 - 10.2 10^3/uL [facility]   RBC (Red Blood Cell Count) 3.18 (L) 4.04 - 5.48 10^6/uL [facility]   Hemoglobin 4.5 (LL) 12.0 - 15.0 gm/dL [facility]   Hematocrit 18.6 (L) 35.0 - 47.0 % [facility]   MCV (Mean Corpuscular Volume) 58.5 (L) 80.0 - 100.0 fl [facility]   MCH (Mean Corpuscular Hemoglobin) 14.2 (L) 27.0 - 31.2 pg [facility]   MCHC (Mean Corpuscular Hemoglobin Concentration) 24.2 (L) 32.0 - 36.0 gm/dL [facility]   Platelet Count 337 150 - 450 10^3/uL [facility]   RDW-CV (Red Cell Distribution Width) 18.1 (H) 11.6 - 14.8 % [facility]   MPV (Mean Platelet Volume) 8.9 (L) 9.4 - 12.4 fl [facility]   Neutrophils 1.90 1.50 - 7.80 10^3/uL [facility]   Lymphocytes 1.30 1.00 - 3.60 10^3/uL [facility]   Mixed Count 0.40 0.10 - 0.90 10^3/uL [facility]   Neutrophil % 51.8 32.0 - 70.0 % [facility]   Lymphocyte % 36.9 10.0 - 50.0 % [facility]   Mixed % 11.3 3.0 - 14.4 % [facility]
